# Patient Record
Sex: Female | Born: 1990 | Race: White | Hispanic: No | Marital: Married
Health system: Southern US, Community
[De-identification: ages and names within clinical notes are randomized; demographics above are authoritative.]

---

## 2015-11-03 DIAGNOSIS — L732 Hidradenitis suppurativa: Secondary | ICD-10-CM | POA: Diagnosis not present

## 2015-11-03 DIAGNOSIS — G43909 Migraine, unspecified, not intractable, without status migrainosus: Secondary | ICD-10-CM | POA: Diagnosis not present

## 2015-11-03 DIAGNOSIS — F419 Anxiety disorder, unspecified: Secondary | ICD-10-CM | POA: Diagnosis not present

## 2015-11-03 DIAGNOSIS — E282 Polycystic ovarian syndrome: Secondary | ICD-10-CM | POA: Diagnosis not present

## 2015-12-05 DIAGNOSIS — F419 Anxiety disorder, unspecified: Secondary | ICD-10-CM | POA: Diagnosis not present

## 2016-01-09 DIAGNOSIS — F419 Anxiety disorder, unspecified: Secondary | ICD-10-CM | POA: Diagnosis not present

## 2016-05-31 DIAGNOSIS — R5383 Other fatigue: Secondary | ICD-10-CM | POA: Diagnosis not present

## 2016-05-31 DIAGNOSIS — R221 Localized swelling, mass and lump, neck: Secondary | ICD-10-CM | POA: Diagnosis not present

## 2016-06-01 ENCOUNTER — Other Ambulatory Visit: Payer: Self-pay | Admitting: Physician Assistant

## 2016-06-01 ENCOUNTER — Ambulatory Visit
Admission: RE | Admit: 2016-06-01 | Discharge: 2016-06-01 | Disposition: A | Discharge status: Home / Self Care | Payer: 59 | Source: Ambulatory Visit | Attending: Physician Assistant | Admitting: Physician Assistant

## 2016-06-01 DIAGNOSIS — R221 Localized swelling, mass and lump, neck: Secondary | ICD-10-CM

## 2016-06-01 DIAGNOSIS — E041 Nontoxic single thyroid nodule: Secondary | ICD-10-CM | POA: Diagnosis not present

## 2016-06-28 DIAGNOSIS — R221 Localized swelling, mass and lump, neck: Secondary | ICD-10-CM | POA: Diagnosis not present

## 2016-06-29 ENCOUNTER — Other Ambulatory Visit: Payer: Self-pay | Admitting: General Surgery

## 2016-06-29 DIAGNOSIS — R221 Localized swelling, mass and lump, neck: Secondary | ICD-10-CM

## 2016-06-30 ENCOUNTER — Ambulatory Visit
Admission: RE | Admit: 2016-06-30 | Discharge: 2016-06-30 | Disposition: A | Discharge status: Home / Self Care | Payer: 59 | Source: Ambulatory Visit | Attending: General Surgery | Admitting: General Surgery

## 2016-06-30 DIAGNOSIS — R221 Localized swelling, mass and lump, neck: Secondary | ICD-10-CM

## 2016-06-30 DIAGNOSIS — R61 Generalized hyperhidrosis: Secondary | ICD-10-CM | POA: Diagnosis not present

## 2016-06-30 DIAGNOSIS — R5383 Other fatigue: Secondary | ICD-10-CM | POA: Diagnosis not present

## 2016-06-30 MED ORDER — IOPAMIDOL (ISOVUE-300) INJECTION 61%
75.0000 mL | Freq: Once | INTRAVENOUS | Status: AC | PRN
  Administered 2016-06-30: 75 mL via INTRAVENOUS

## 2016-07-02 ENCOUNTER — Telehealth: Payer: Self-pay | Admitting: General Surgery

## 2016-07-02 NOTE — Telephone Encounter (Signed)
I have reviewed her CT scan and the report.  There is a 6 mm lymph node at the area of concern which is not pathologic in size.  No masses in the area of concern.  I discussed this with her.  I do not recommend any further evaluation unless she develops a clinically large mass in that area.

## 2016-07-30 ENCOUNTER — Other Ambulatory Visit (HOSPITAL_COMMUNITY): Payer: Self-pay | Admitting: Physician Assistant

## 2016-07-30 DIAGNOSIS — R221 Localized swelling, mass and lump, neck: Secondary | ICD-10-CM

## 2016-10-18 DIAGNOSIS — Z01419 Encounter for gynecological examination (general) (routine) without abnormal findings: Secondary | ICD-10-CM | POA: Diagnosis not present

## 2016-10-18 DIAGNOSIS — Z6841 Body Mass Index (BMI) 40.0 and over, adult: Secondary | ICD-10-CM | POA: Diagnosis not present

## 2016-11-04 DIAGNOSIS — Z3202 Encounter for pregnancy test, result negative: Secondary | ICD-10-CM | POA: Diagnosis not present

## 2016-11-04 DIAGNOSIS — Z30433 Encounter for removal and reinsertion of intrauterine contraceptive device: Secondary | ICD-10-CM | POA: Diagnosis not present

## 2016-11-12 DIAGNOSIS — K219 Gastro-esophageal reflux disease without esophagitis: Secondary | ICD-10-CM | POA: Diagnosis not present

## 2016-11-12 DIAGNOSIS — F419 Anxiety disorder, unspecified: Secondary | ICD-10-CM | POA: Diagnosis not present

## 2016-11-12 DIAGNOSIS — F902 Attention-deficit hyperactivity disorder, combined type: Secondary | ICD-10-CM | POA: Diagnosis not present

## 2017-04-15 ENCOUNTER — Other Ambulatory Visit: Payer: Self-pay | Admitting: Family Medicine

## 2017-04-15 DIAGNOSIS — R221 Localized swelling, mass and lump, neck: Secondary | ICD-10-CM

## 2017-04-20 ENCOUNTER — Ambulatory Visit
Admission: RE | Admit: 2017-04-20 | Discharge: 2017-04-20 | Disposition: A | Discharge status: Home / Self Care | Payer: 59 | Source: Ambulatory Visit | Attending: Family Medicine | Admitting: Family Medicine

## 2017-04-20 DIAGNOSIS — R221 Localized swelling, mass and lump, neck: Secondary | ICD-10-CM | POA: Diagnosis not present

## 2017-05-05 DIAGNOSIS — H52223 Regular astigmatism, bilateral: Secondary | ICD-10-CM | POA: Diagnosis not present

## 2017-05-05 DIAGNOSIS — H5213 Myopia, bilateral: Secondary | ICD-10-CM | POA: Diagnosis not present

## 2020-01-17 IMAGING — US US SOFT TISSUE HEAD/NECK
1 series · 9 of 9 positions shown · non-contrast
Comparison: None.

CLINICAL DATA: Right neck swelling, posterior to the right ear.

EXAM:
ULTRASOUND OF HEAD/NECK SOFT TISSUES
TECHNIQUE: Ultrasound examination of the head and neck soft tissues was
performed in the area of clinical concern.

[Series 1: us soft tissue head/neck · 0.05mm/px · 9 of 9 slices shown]
[im 1/9]
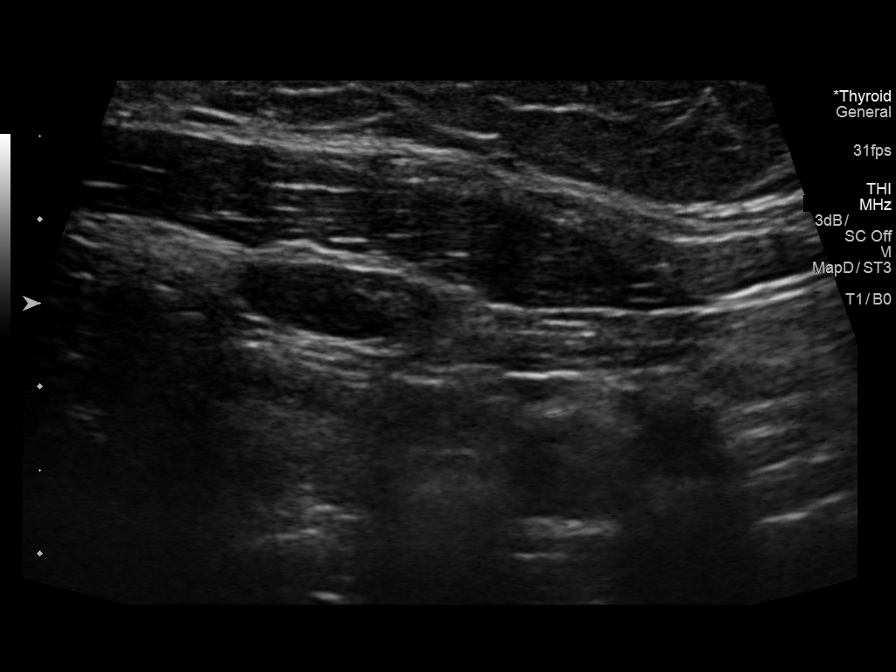
[im 2/9]
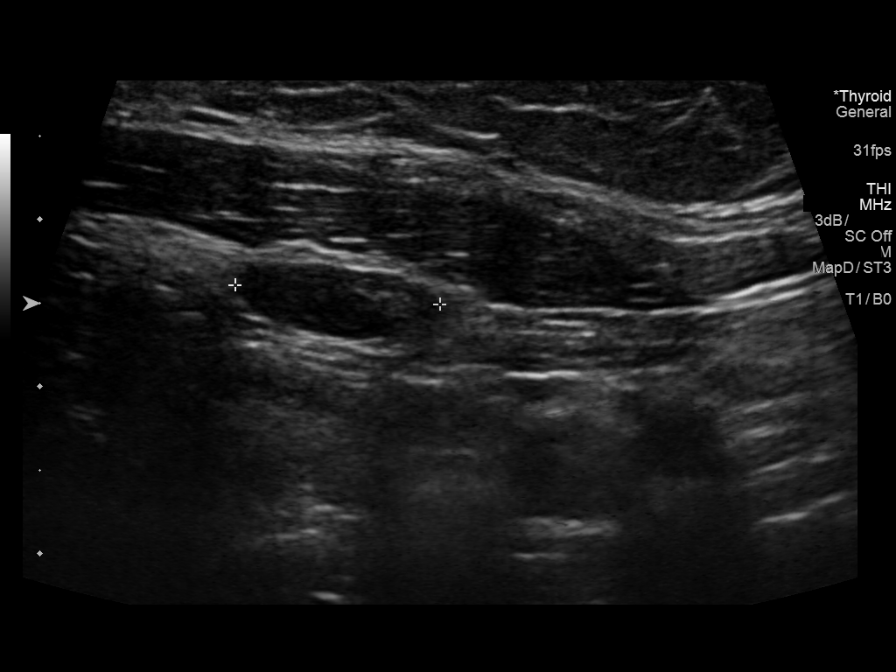
[im 3/9]
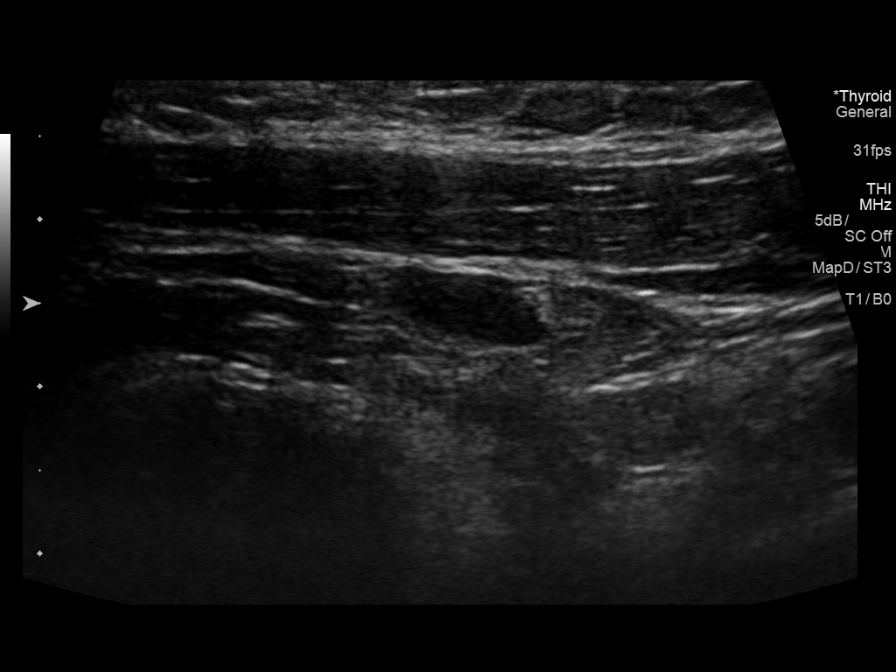
[im 4/9]
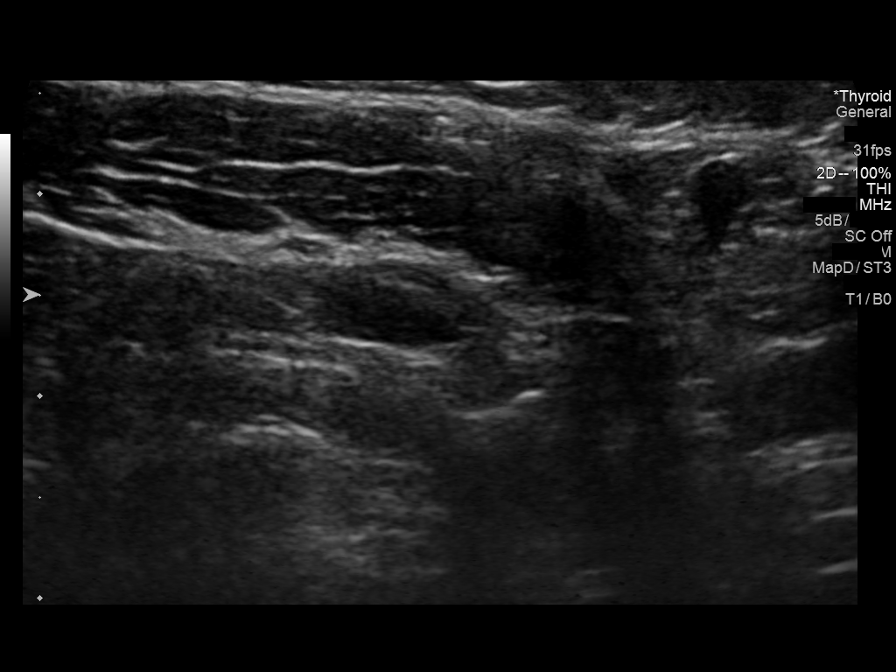
[im 5/9]
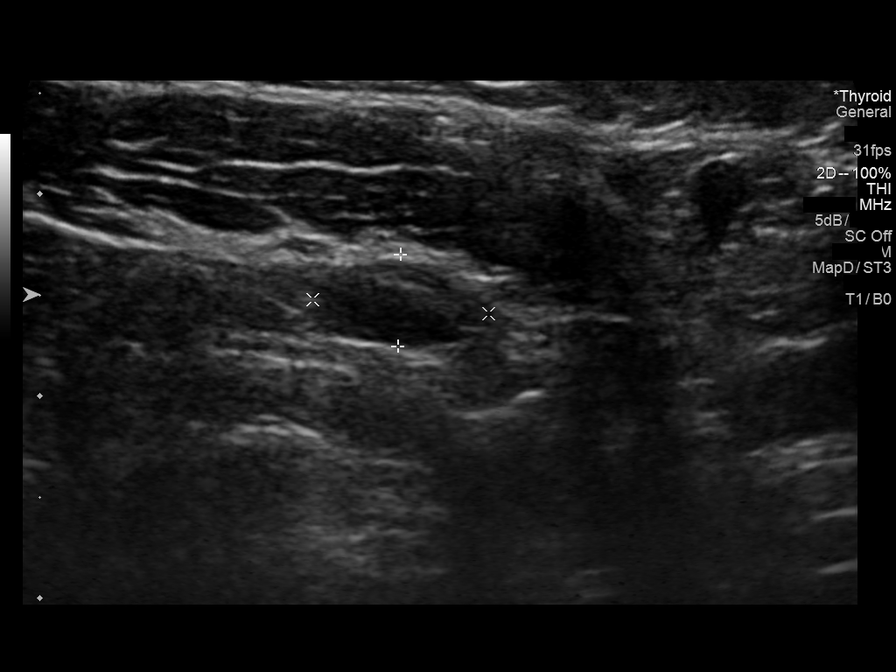
[im 6/9]
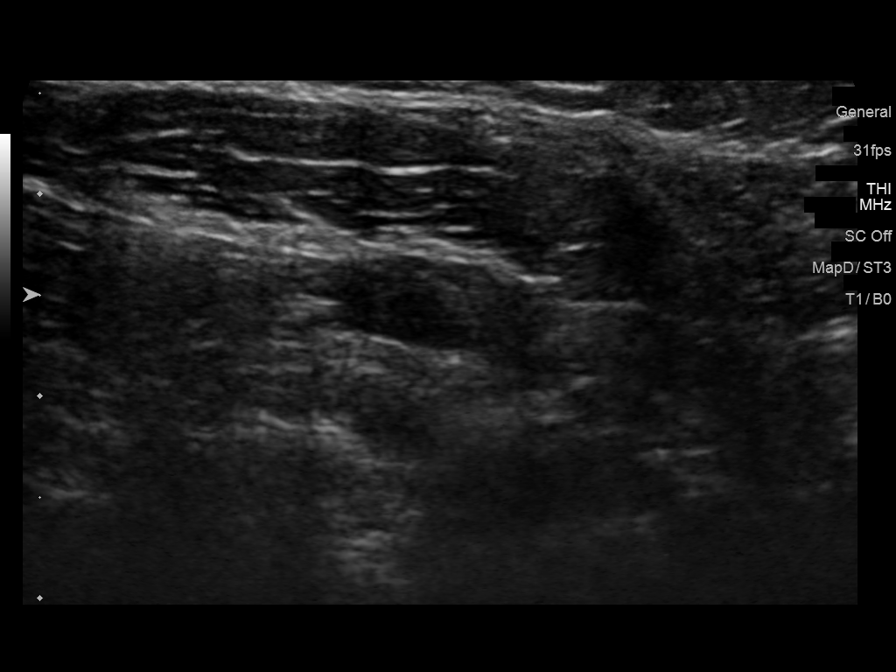
[im 7/9]
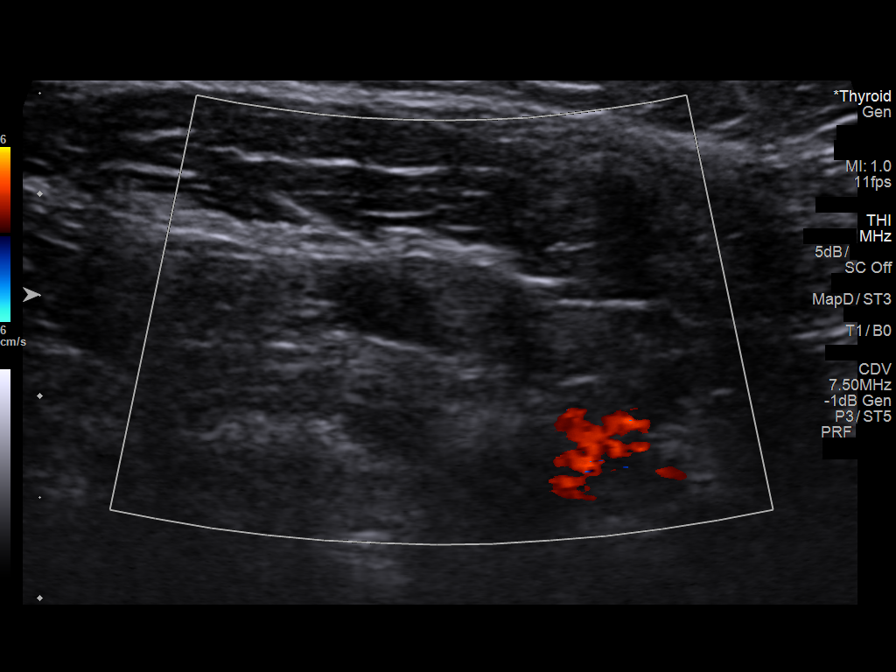
[im 8/9]
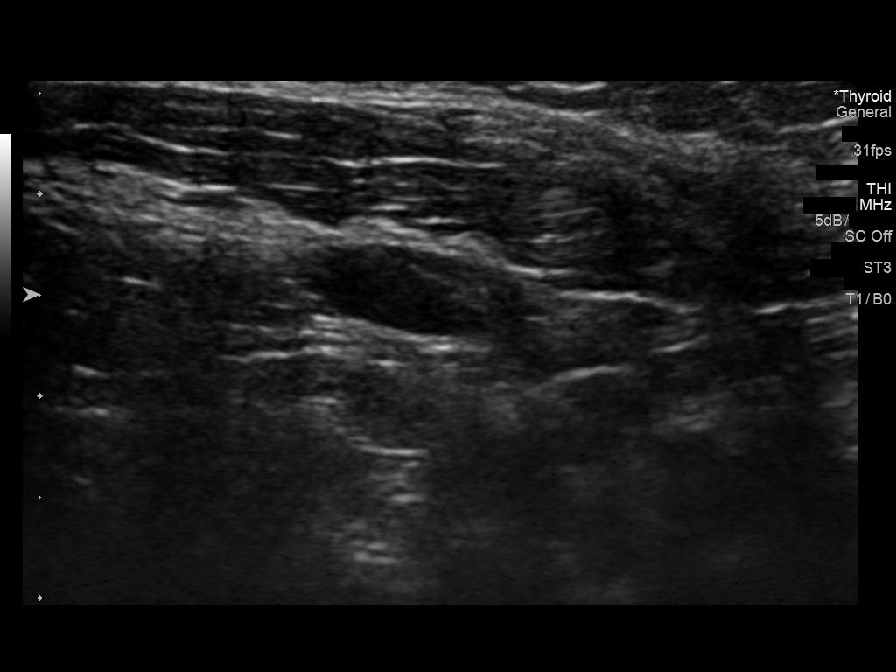
[im 9/9]
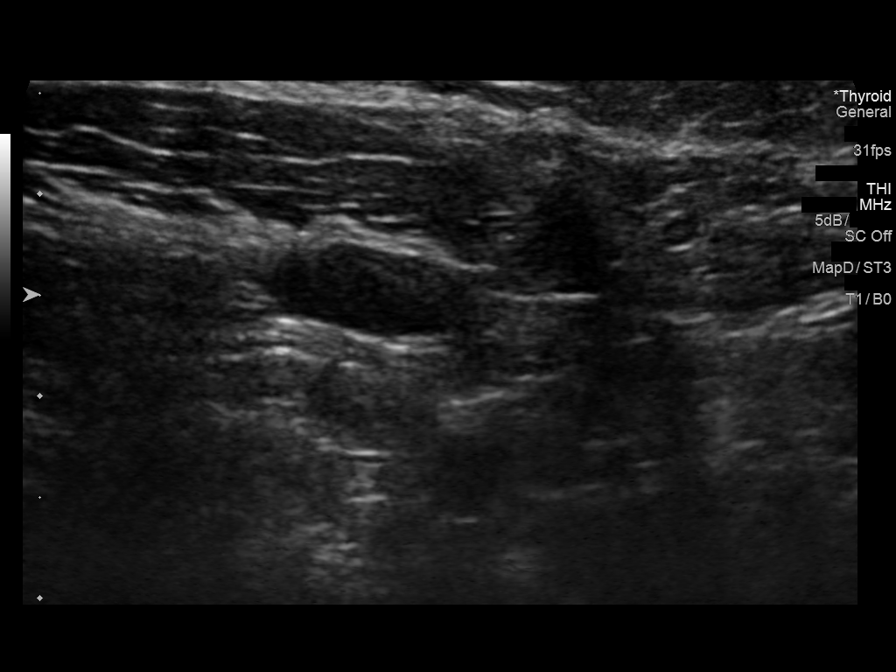

[9 of 9 positions shown; findings below may reference images not displayed]

FINDINGS: There is an oval-shaped hypoechoic nodule at the area of concern.
This structure measures 1.2 x 0.5 x 0.9 cm. Morphologic
characteristics are suggestive for a small lymph node.
IMPRESSION: Small lymph node at the area of concern.
